# Patient Record
Sex: Male | Born: 2000 | Race: Black or African American | Hispanic: No | Smoking: Never smoker
Health system: Southern US, Community
[De-identification: ages and names within clinical notes are randomized; demographics above are authoritative.]

## PROBLEM LIST (undated history)

## (undated) ENCOUNTER — Ambulatory Visit: Discharge status: Absent without leave | Payer: Medicaid Other

## (undated) DIAGNOSIS — Z8739 Personal history of other diseases of the musculoskeletal system and connective tissue: Secondary | ICD-10-CM

## (undated) HISTORY — DX: Personal history of other diseases of the musculoskeletal system and connective tissue: Z87.39

---
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
## 2020-01-17 ENCOUNTER — Ambulatory Visit (INDEPENDENT_AMBULATORY_CARE_PROVIDER_SITE_OTHER): Payer: Medicaid Other

## 2020-01-17 ENCOUNTER — Other Ambulatory Visit: Payer: Self-pay

## 2020-01-17 ENCOUNTER — Ambulatory Visit
Admission: EM | Admit: 2020-01-17 | Discharge: 2020-01-17 | Disposition: A | Discharge status: Home / Self Care | Payer: Medicaid Other | Attending: Emergency Medicine | Admitting: Emergency Medicine

## 2020-01-17 DIAGNOSIS — G8929 Other chronic pain: Secondary | ICD-10-CM | POA: Diagnosis not present

## 2020-01-17 DIAGNOSIS — M25571 Pain in right ankle and joints of right foot: Secondary | ICD-10-CM

## 2020-01-17 DIAGNOSIS — S92251A Displaced fracture of navicular [scaphoid] of right foot, initial encounter for closed fracture: Secondary | ICD-10-CM | POA: Diagnosis not present

## 2020-01-17 DIAGNOSIS — S93409A Sprain of unspecified ligament of unspecified ankle, initial encounter: Secondary | ICD-10-CM

## 2020-01-17 DIAGNOSIS — Y9367 Activity, basketball: Secondary | ICD-10-CM | POA: Diagnosis not present

## 2020-01-17 NOTE — ED Triage Notes (Signed)
Pt c/o right ankle pain for approx 2 months s/p fall while playing basketball where he "rolled it" during a fall. Pt states he has been applying ice and taking tylenol. Pt c/o pain to right lateral ankle area and top of foot.  Foot/ankle warm to touch, +2 DP pulse, cap refill brisk, n/v sensation intact.

## 2020-01-17 NOTE — Discharge Instructions (Signed)

## 2020-01-17 NOTE — ED Provider Notes (Signed)
[facility]    CSN: [id number] Arrival date & time: 01/17/20  1352      History   Chief Complaint Chief Complaint  Patient presents with  . Ankle Pain    HPI Carl Morales is a 19 y.o. male presenting for 33-month course of persistent right ankle pain and swelling.  Patient states he plays basketball and frequently has ankle inversion injuries.  States his last one was about 2 months ago.  Typically goes away with RICE, though has been persistent.  Denies repeat traumatization.  Seeking x-ray today.   History reviewed. No pertinent past medical history.  There are no problems to display for this patient.   History reviewed. No pertinent surgical history.     Home Medications    Prior to Admission medications   Medication Sig Start Date End Date Taking? Authorizing Provider  fluticasone (FLONASE) 50 MCG/ACT nasal spray Place into the nose.   Yes [provider]  loratadine (CLARITIN) 10 MG tablet Take by mouth.   Yes [provider]    Family History Family History  Problem Relation Age of Onset  . Healthy Mother   . Diabetes Father     Social History Social History   Tobacco Use  . Smoking status: Never Smoker  . Smokeless tobacco: Never Used  Vaping Use  . Vaping Use: Never used  Substance Use Topics  . Alcohol use: Never  . Drug use: Never     Allergies   Patient has no known allergies.   Review of Systems As per HPI   Physical Exam Triage Vital Signs ED Triage Vitals  Enc Vitals Group     BP      Pulse      Resp      Temp      Temp src      SpO2      Weight      Height      Head Circumference      Peak Flow      Pain Score      Pain Loc      Pain Edu?      Excl. in Concrete?    No data found.  Updated Vital Signs BP (!) 146/84 (BP Location: Left Arm)   Pulse 71   Temp 98 F (36.7 C) (Oral)   Resp 16   SpO2 98%   Visual Acuity Right Eye Distance:   Left Eye Distance:   Bilateral Distance:     Right Eye Near:   Left Eye Near:    Bilateral Near:     Physical Exam Constitutional:      General: He is not in acute distress. HENT:     Head: Normocephalic and atraumatic.  Eyes:     General: No scleral icterus.    Pupils: Pupils are equal, round, and reactive to light.  Cardiovascular:     Rate and Rhythm: Normal rate.  Pulmonary:     Effort: Pulmonary effort is normal. No respiratory distress.     Breath sounds: No wheezing.  Musculoskeletal:     Comments: Mild swelling in right ankle as compared to left.  Lateral malleoli tenderness.  No medial malleoli tenderness.  No tarsal or phalangeal tenderness.  NVI.  Gait mildly antalgic, favoring right  Skin:    Coloration: Skin is not jaundiced or pale.  Neurological:     Mental Status: He is alert and oriented to person, place, and time.  UC Treatments / Results  Labs (all labs ordered are listed, but only abnormal results are displayed) Labs Reviewed - No data to display  EKG   Radiology DG Ankle Complete Right  Result Date: 01/17/2020 CLINICAL DATA:  Chronic right ankle pain for 2 months. Multiple prior sprains. Anterolateral pain. EXAM: RIGHT ANKLE - COMPLETE 3+ VIEW COMPARISON:  None. FINDINGS: There is avulsion of bone from the dorsal aspect of the navicular. Distal tibia and fibula and talus appear normal. Ankle effusion. IMPRESSION: 1. Avulsion of bone from the dorsal aspect of the navicular. 2. Ankle effusion. Electronically Signed   By: Francene Boyers M.D.   On: 01/17/2020 14:43    Procedures Procedures (including critical care time)  Medications Ordered in UC Medications - No data to display  Initial Impression / Assessment and Plan / UC Course  I have reviewed the triage vital signs and the nursing notes.  Pertinent labs & imaging results that were available during my care of the patient were reviewed by me and considered in my medical decision making (see chart for details).     Discussed low  utility of x-ray given 68-month chronicity.  Patient requesting radiography specifically.  Right ankle x-ray done office, reviewed by me radiology: Significant for avulsion of bone from dorsal aspect of navicular, ankle effusion.  Reviewed case with PA Earney Hamburg who agrees with A/P:  ASO applied in office which patient tolerated well.  Will follow up with sports medicine for further evaluation/management if needed.  Return precautions discussed, patient verbalized understanding and is agreeable to plan. Final Clinical Impressions(s) / UC Diagnoses   Final diagnoses:  Chronic pain of right ankle  Inversion sprain of ankle, initial encounter  Closed displaced fracture of navicular bone of right foot, initial encounter     Discharge Instructions     Recommend RICE: rest, ice, compression, elevation as needed for pain.    Heat therapy (hot compress, warm wash rag, hot showers, etc.) can help relax muscles and soothe muscle aches. Cold therapy (ice packs) can be used to help swelling both after injury and after prolonged use of areas of chronic pain/aches.  For pain: recommend 350 mg-1000 mg of Tylenol (acetaminophen) and/or 200 mg - 800 mg of Advil (ibuprofen, Motrin) every 8 hours as needed.  May alternate between the two throughout the day as they are generally safe to take together.  DO NOT exceed more than 3000 mg of Tylenol or 3200 mg of ibuprofen in a 24 hour period as this could damage your stomach, kidneys, liver, or increase your bleeding risk.    ED Prescriptions    None     PDMP not reviewed this encounter.   Hall[place]Potvin, [place], [place] 01/17/20 1511

## 2020-11-01 ENCOUNTER — Ambulatory Visit
Admission: EM | Admit: 2020-11-01 | Discharge: 2020-11-01 | Disposition: A | Discharge status: Home / Self Care | Payer: Medicaid Other | Attending: Urgent Care | Admitting: Urgent Care

## 2020-11-01 ENCOUNTER — Other Ambulatory Visit: Payer: Self-pay

## 2020-11-01 ENCOUNTER — Encounter: Payer: Self-pay | Admitting: Emergency Medicine

## 2020-11-01 DIAGNOSIS — R0789 Other chest pain: Secondary | ICD-10-CM | POA: Diagnosis not present

## 2020-11-01 DIAGNOSIS — R0602 Shortness of breath: Secondary | ICD-10-CM

## 2020-11-01 DIAGNOSIS — R9431 Abnormal electrocardiogram [ECG] [EKG]: Secondary | ICD-10-CM

## 2020-11-01 MED ORDER — FAMOTIDINE 20 MG PO TABS
20.0000 mg | ORAL_TABLET | Freq: Two times a day (BID) | ORAL | 0 refills | Status: DC
Start: 2020-11-01 — End: 2021-11-19

## 2020-11-01 NOTE — ED Provider Notes (Signed)
[facility]   MRN: [id number] DOB: 06/17/2001  Subjective:   Carl Morales is a 20 y.o. male presenting for 2-day history of acute onset midsternal chest pain with chest tightness, shortness of breath, heart racing and palpitations.  Patient is very worried about his symptoms.  This started when he was sitting on the couch watching TV.  Denies fever, headache, confusion, runny or stuffy nose, sore throat, cough, belly pain, nausea, vomiting.  Denies personal or family history of arrhythmias, heart conditions.  Patient works at a [employer] place and eats there multiple times a week.  Tries to hydrate with water.  Denies drug use, alcohol use, cigarette use.  He does have a history of allergies but does not take any medications consistently for this.  No current facility-administered medications for this encounter.  Current Outpatient Medications:  .  fluticasone (FLONASE) 50 MCG/ACT nasal spray, Place into the nose., Disp: , Rfl:  .  loratadine (CLARITIN) 10 MG tablet, Take by mouth., Disp: , Rfl:    No Known Allergies  History reviewed. No pertinent past medical history.   History reviewed. No pertinent surgical history.  Family History  Problem Relation Age of Onset  . Healthy Mother   . Diabetes Father     Social History   Tobacco Use  . Smoking status: Never Smoker  . Smokeless tobacco: Never Used  Vaping Use  . Vaping Use: Never used  Substance Use Topics  . Alcohol use: Never  . Drug use: Never    ROS   Objective:   Vitals: BP (!) 153/87 (BP Location: Left Arm)   Pulse 75   Temp 97.8 F (36.6 C) (Oral)   Resp 18   SpO2 100%   Physical Exam Constitutional:      General: He is not in acute distress.    Appearance: Normal appearance. He is well-developed. He is not ill-appearing, toxic-appearing or diaphoretic.  HENT:     Head: Normocephalic and atraumatic.     Right Ear: External ear normal.     Left Ear: External ear normal.     Nose:  Nose normal.     Mouth/Throat:     Mouth: Mucous membranes are moist.     Pharynx: Oropharynx is clear.  Eyes:     General: No scleral icterus.       Right eye: No discharge.        Left eye: No discharge.     Extraocular Movements: Extraocular movements intact.     Conjunctiva/sclera: Conjunctivae normal.     Pupils: Pupils are equal, round, and reactive to light.  Cardiovascular:     Rate and Rhythm: Normal rate and regular rhythm.     Heart sounds: Normal heart sounds. No murmur heard. No friction rub. No gallop.   Pulmonary:     Effort: Pulmonary effort is normal. No respiratory distress.     Breath sounds: Normal breath sounds. No stridor. No wheezing, rhonchi or rales.  Abdominal:     General: Bowel sounds are normal. There is no distension.     Palpations: Abdomen is soft. There is no mass.     Tenderness: There is no abdominal tenderness. There is no right CVA tenderness, left CVA tenderness, guarding or rebound.  Skin:    General: Skin is warm and dry.  Neurological:     Mental Status: He is alert and oriented to person, place, and time.  Psychiatric:        Mood and Affect:  Mood normal.        Behavior: Behavior normal.        Thought Content: Thought content normal.        Judgment: Judgment normal.     ED ECG REPORT   Date: 11/01/2020  EKG Time: 3:50 PM  Rate: 70bpm  Rhythm: normal sinus rhythm,  normal EKG, normal sinus rhythm, there are no previous tracings available for comparison, incomplete RBBB  Axis: normal  ST&T Change: none  Narrative Interpretation: Sinus rhythm at 70bpm with incomplete RBBB, early repolarization. No previous ecg for comparison.    Assessment and Plan :   PDMP not reviewed this encounter.  1. Atypical chest pain   2. Nonspecific abnormal electrocardiogram (ECG) (EKG)   3. Shortness of breath     Patient has non-specific abnormal ecg and needs cardiology consultation. Provided him with information for this. In the meantime,  will address GERD with famotidine, dietary modifications. Counseled patient on potential for adverse effects with medications prescribed/recommended today, ER and return-to-clinic precautions discussed, patient verbalized understanding.    Wallis Bamberg, [place] 11/01/20 [phone]

## 2020-11-01 NOTE — ED Triage Notes (Signed)
Pt here for anxiety with some chest tightness x 2 days; pt appears very anxious; pt with poor eye contact denies SI/HI

## 2020-11-09 ENCOUNTER — Ambulatory Visit: Payer: Medicaid Other | Admitting: Cardiology

## 2020-11-11 NOTE — Progress Notes (Signed)
Date:  11/12/2020   ID:  Carl Morales, DOB 07-09-2001, MRN [id number]  PCP:  Patient, No Pcp Per (Inactive)  Cardiologist:  Tessa Lerner, DO, [facility] (established care 11/12/2020)  REASON FOR CONSULT: History of scoliosis  REQUESTING PHYSICIAN:  Self-referral  Chief Complaint  Patient presents with  . Chest Pain  . New Patient (Initial Visit)    HPI  Carl Morales is a 20 y.o. male who presents to the office with a chief complaint of " chest pain." Patient's past medical history and cardiovascular risk factors include: History of scoliosis, incomplete right bundle branch block, family history of CAD.  After recent urgent care visit patient has a self-referred in to our practice for further evaluation of chest pain and scoliosis  Patient is accompanied by his mom Carl Morales and he provides verbal consent with regards to discussing his medical information in her presence.  Patient states that a couple weeks ago he went to urgent care for chest pain evaluation.  At that time the discomfort was located substernally, occurred at rest, 4 out of 10 in intensity, no improving or worsening factors.  The pain is not brought on by effort related activities and does not resolve with rest.  Patient states in the urgent care he was thought to be most likely heartburn related it was given appropriate pharmacological therapy and sent home on medications.  Since his urgent care visit patient states that he intermittently/randomly has chest discomfort.  Intensity 3 out of 10, lasting for 5 to 10 minutes, no predisposing factors, not brought on by up related activities and does not resolve with rest but still self-limited.  The discomfort is nonpositional, and nonpleuritic.  No recent infections, or sick contacts.  No known history of COVID-19 infection or autoimmune disorders.  Patient mom states that he was born healthy via vaginal delivery and no known congenital heart disease.  Patient has not been  evaluated by pediatric cardiologist in the past.  When growing up patient was very active and never has had exertional syncope.  He continues to play basketball at least for 2 hours every week at a local gym.  History is positive for scoliosis for which he used to wear a brace for approximately 1 year.  Paternal grandfather died of a myocardial infarction at the age of 8.  Per patient's mom no family history of sudden cardiac death, cardiomyopathies or congestive heart failure, or aortic syndromes.  FUNCTIONAL STATUS: He goes to the gym twice a week to play basketball for at least 2 hours.  ALLERGIES: No Known Allergies  MEDICATION LIST PRIOR TO VISIT: Current Meds  Medication Sig  . famotidine (PEPCID) 20 MG tablet Take 1 tablet (20 mg total) by mouth 2 (two) times daily.  . fluticasone (FLONASE) 50 MCG/ACT nasal spray Place into the nose.  . loratadine (CLARITIN) 10 MG tablet Take by mouth.     PAST MEDICAL HISTORY: Past Medical History:  Diagnosis Date  . History of scoliosis     PAST SURGICAL HISTORY: History reviewed. No pertinent surgical history.  FAMILY HISTORY: The patient family history includes Diabetes in his father; Healthy in his mother; Heart attack in his paternal grandfather.  SOCIAL HISTORY:  The patient  reports that he has never smoked. He has never used smokeless tobacco. He reports that he does not drink alcohol and does not use drugs.  REVIEW OF SYSTEMS: Review of Systems  Constitutional: Negative for chills and fever.  HENT: Negative for hoarse voice  and nosebleeds.   Eyes: Negative for discharge, double vision and pain.  Cardiovascular: Negative for chest pain, claudication, dyspnea on exertion, leg swelling, near-syncope, orthopnea, palpitations, paroxysmal nocturnal dyspnea and syncope.  Respiratory: Negative for hemoptysis and shortness of breath.   Musculoskeletal: Negative for muscle cramps and myalgias.  Gastrointestinal: Negative for  abdominal pain, constipation, diarrhea, hematemesis, hematochezia, melena, nausea and vomiting.  Neurological: Negative for dizziness and light-headedness.    PHYSICAL EXAM: Vitals with BMI 11/12/2020 11/01/2020 01/17/2020  Height 6\' 5"  - -  Weight 160 lbs 6 oz - -  BMI 19.02 - -  Systolic 139 153  Diastolic 85 87 84  Pulse 71 75 71    CONSTITUTIONAL: Well-developed and well-nourished. No acute distress.  SKIN: Skin is warm and dry. No rash noted. No cyanosis. No pallor. No jaundice HEAD: Normocephalic and atraumatic.  EYES: No scleral icterus MOUTH/THROAT: Moist oral membranes.  NECK: No JVD present. No thyromegaly noted. No carotid bruits  LYMPHATIC: No visible cervical adenopathy.  CHEST Normal respiratory effort. No intercostal retractions.  Posteriorly scoliotic curvature of the spine with convexity to the right side. LUNGS: Clear to auscultation bilaterally. no stridor. No wheezes. No rales.  CARDIOVASCULAR: Regular, positive S1-S2, no murmurs rubs or gallops appreciated ABDOMINAL: No apparent ascites.  EXTREMITIES: No peripheral edema  HEMATOLOGIC: No significant bruising NEUROLOGIC: Oriented to person, place, and time. Nonfocal. Normal muscle tone.  PSYCHIATRIC: Normal mood and affect. Normal behavior. Cooperative  RADIOLOGY: Chest x-ray 06/19/2014: Thoracolumbar scoliosis. The upper thoracic component convex to the left measures 17 degrees with a lower thoracic component convex to the right measuring 19 degrees.  CARDIAC DATABASE: EKG: 11/12/2020: Normal sinus rhythm, 79 bpm, incomplete right bundle branch block, without underlying injury pattern.  No prior EKGs for comparison.  Echocardiogram: No results found for this or any previous visit from the past 1095 days.   Stress Testing: No results found for this or any previous visit from the past 1095 days.  Heart Catheterization: None  LABORATORY DATA: No flowsheet data found.  No flowsheet data  found.  Lipid Panel  No results found for: CHOL, TRIG, HDL, CHOLHDL, VLDL, LDLCALC, LDLDIRECT, LABVLDL  No components found for: NTPROBNP No results for input(s): PROBNP in the last 8760 hours. No results for input(s): TSH in the last 8760 hours.  BMP No results for input(s): NA, K, CL, CO2, GLUCOSE, BUN, CREATININE, CALCIUM, GFRNONAA, GFRAA in the last 8760 hours.  HEMOGLOBIN A1C No results found for: HGBA1C, MPG  IMPRESSION:    ICD-10-CM   1. Precordial pain  R07.2 EKG 12-Lead    PCV ECHOCARDIOGRAM COMPLETE    PCV CARDIAC STRESS TEST    SARS-COV-2 RNA,(COVID-19) QUAL NAAT  2. History of scoliosis  Z87.39   3. Incomplete right bundle branch block  I45.10   4. Family history of heart disease  Z82.49      RECOMMENDATIONS: Carl Morales is a 20 y.o. male whose past medical history and cardiac risk factors include: History of scoliosis, incomplete right bundle branch block, family history of CAD.  Precordial pain: Most likely noncardiac in etiology.  Recommended conservative management. However, based on our discussion he continues to be very concerned and therefore the shared decision was to proceed with an ischemic evaluation. Echocardiogram will be ordered to evaluate for structural heart disease and left ventricular systolic function. Plan GXT to evaluate for exercise-induced ischemia. COVID screen prior to GXT. Encouraged to continue taking Pepcid.  However if he requires long-term therapy he  would be advisable to see gastroenterology to rule out underlying pathology.  Conveyed this to both the patient and his mom at today's office visit.  Family history of heart disease: Educated on the importance of primary prevention.  Incomplete right bundle branch block: Continue to monitor  FINAL MEDICATION LIST END OF ENCOUNTER: No orders of the defined types were placed in this encounter.   There are no discontinued medications.   Current Outpatient Medications:  .   famotidine (PEPCID) 20 MG tablet, Take 1 tablet (20 mg total) by mouth 2 (two) times daily., Disp: 60 tablet, Rfl: 0 .  fluticasone (FLONASE) 50 MCG/ACT nasal spray, Place into the nose., Disp: , Rfl:  .  loratadine (CLARITIN) 10 MG tablet, Take by mouth., Disp: , Rfl:   Orders Placed This Encounter  Procedures  . SARS-COV-2 RNA,(COVID-19) QUAL NAAT  . PCV CARDIAC STRESS TEST  . EKG 12-Lead  . PCV ECHOCARDIOGRAM COMPLETE    There are no Patient Instructions on file for this visit.   --Continue cardiac medications as reconciled in final medication list. --Return in about 6 weeks (around 12/24/2020) for Follow up, Chest pain, Review test results. Or sooner if needed. --Continue follow-up with your primary care physician regarding the management of your other chronic comorbid conditions.  Patient's questions and concerns were addressed to his satisfaction. He voices understanding of the instructions provided during this encounter.   This note was created using a voice recognition software as a result there may be grammatical errors inadvertently enclosed that do not reflect the nature of this encounter. Every attempt is made to correct such errors.  Tessa Lerner, [place]  Pager: [phone] Office: [phone]

## 2020-11-12 ENCOUNTER — Ambulatory Visit: Payer: Medicaid Other | Admitting: Cardiology

## 2020-11-12 ENCOUNTER — Other Ambulatory Visit: Payer: Self-pay

## 2020-11-12 ENCOUNTER — Encounter: Payer: Self-pay | Admitting: Cardiology

## 2020-11-12 VITALS — BP 139/85 | HR 71 | Temp 98.3°F | Resp 16 | Ht 77.0 in | Wt 160.4 lb

## 2020-11-12 DIAGNOSIS — Z8739 Personal history of other diseases of the musculoskeletal system and connective tissue: Secondary | ICD-10-CM

## 2020-11-12 DIAGNOSIS — Z8249 Family history of ischemic heart disease and other diseases of the circulatory system: Secondary | ICD-10-CM

## 2020-11-12 DIAGNOSIS — R072 Precordial pain: Secondary | ICD-10-CM

## 2020-11-12 DIAGNOSIS — I4510 Unspecified right bundle-branch block: Secondary | ICD-10-CM

## 2020-11-24 ENCOUNTER — Ambulatory Visit: Payer: Medicaid Other

## 2020-11-24 ENCOUNTER — Other Ambulatory Visit: Payer: Self-pay

## 2020-11-24 DIAGNOSIS — R072 Precordial pain: Secondary | ICD-10-CM

## 2020-11-27 ENCOUNTER — Ambulatory Visit: Payer: Medicaid Other

## 2020-11-27 ENCOUNTER — Other Ambulatory Visit: Payer: Self-pay

## 2020-11-27 DIAGNOSIS — R072 Precordial pain: Secondary | ICD-10-CM

## 2020-11-30 NOTE — Progress Notes (Signed)
Pt mother called back and was informed about is stress test

## 2020-11-30 NOTE — Progress Notes (Signed)
Called pt no answer, left a vm

## 2020-11-30 NOTE — Progress Notes (Signed)
Pt mother called back and was informed about his echo. She understood

## 2021-01-01 ENCOUNTER — Ambulatory Visit: Payer: Medicaid Other | Admitting: Cardiology

## 2021-01-14 ENCOUNTER — Ambulatory Visit (INDEPENDENT_AMBULATORY_CARE_PROVIDER_SITE_OTHER): Payer: Medicaid Other

## 2021-01-14 ENCOUNTER — Other Ambulatory Visit: Payer: Self-pay

## 2021-01-14 ENCOUNTER — Ambulatory Visit
Admission: EM | Admit: 2021-01-14 | Discharge: 2021-01-14 | Disposition: A | Discharge status: Home / Self Care | Payer: Medicaid Other | Attending: Emergency Medicine | Admitting: Emergency Medicine

## 2021-01-14 DIAGNOSIS — M25572 Pain in left ankle and joints of left foot: Secondary | ICD-10-CM

## 2021-01-14 DIAGNOSIS — S93492A Sprain of other ligament of left ankle, initial encounter: Secondary | ICD-10-CM | POA: Diagnosis not present

## 2021-01-14 MED ORDER — IBUPROFEN 800 MG PO TABS
800.0000 mg | ORAL_TABLET | Freq: Three times a day (TID) | ORAL | 0 refills | Status: DC
Start: 2021-01-14 — End: 2021-11-19

## 2021-01-14 NOTE — ED Provider Notes (Signed)
[facility]    CSN: [id number] Arrival date & time: 01/14/21  1441      History   Chief Complaint Chief Complaint  Patient presents with   Ankle Pain    left    HPI Carl Morales is a 20 y.o. male presenting today for left foot pain.  Reports left ankle injury approximately 2 weeks ago.  Was playing basketball came down and landed on someone else's foot causing his left ankle to invert.  Has had continued pain and swelling.  Denies any high impact activities since, but has continued to work and he is on his feet for a large portion of the day.  Denies history of prior fractures or ankle injuries.  HPI  Past Medical History:  Diagnosis Date   History of scoliosis     There are no problems to display for this patient.   History reviewed. No pertinent surgical history.     Home Medications    Prior to Admission medications   Medication Sig Start Date End Date Taking? Authorizing Provider  ibuprofen (ADVIL) 800 MG tablet Take 1 tablet (800 mg total) by mouth 3 (three) times daily. 01/14/21  Yes Tylique Aull C, PA-C  famotidine (PEPCID) 20 MG tablet Take 1 tablet (20 mg total) by mouth 2 (two) times daily. 11/01/20   Wallis Bamberg, PA-C  fluticasone (FLONASE) 50 MCG/ACT nasal spray Place into the nose.    [provider]  loratadine (CLARITIN) 10 MG tablet Take by mouth.    [provider]    Family History Family History  Problem Relation Age of Onset   Healthy Mother    Diabetes Father    Heart attack Paternal Grandfather     Social History Social History   Tobacco Use   Smoking status: Never   Smokeless tobacco: Never  Vaping Use   Vaping Use: Never used  Substance Use Topics   Alcohol use: Never   Drug use: Never     Allergies   Patient has no known allergies.   Review of Systems Review of Systems  Constitutional:  Negative for fatigue and fever.  Eyes:  Negative for redness, itching and visual disturbance.   Respiratory:  Negative for shortness of breath.   Cardiovascular:  Negative for chest pain and leg swelling.  Gastrointestinal:  Negative for nausea and vomiting.  Musculoskeletal:  Positive for arthralgias, gait problem and joint swelling. Negative for myalgias.  Skin:  Negative for color change, rash and wound.  Neurological:  Negative for dizziness, syncope, weakness, light-headedness and headaches.    Physical Exam Triage Vital Signs ED Triage Vitals  Enc Vitals Group     BP      Pulse      Resp      Temp      Temp src      SpO2      Weight      Height      Head Circumference      Peak Flow      Pain Score      Pain Loc      Pain Edu?      Excl. in GC?    No data found.  Updated Vital Signs BP 131/78 (BP Location: Left Arm)   Pulse 72   Temp 98.4 F (36.9 C) (Oral)   Resp 18   SpO2 98%   Visual Acuity Right Eye Distance:   Left Eye Distance:   Bilateral Distance:  Right Eye Near:   Left Eye Near:    Bilateral Near:     Physical Exam Vitals and nursing note reviewed.  Constitutional:      Appearance: He is well-developed.     Comments: No acute distress  HENT:     Head: Normocephalic and atraumatic.     Nose: Nose normal.  Eyes:     Conjunctiva/sclera: Conjunctivae normal.  Cardiovascular:     Rate and Rhythm: Normal rate.  Pulmonary:     Effort: Pulmonary effort is normal. No respiratory distress.  Abdominal:     General: There is no distension.  Musculoskeletal:        General: Normal range of motion.     Cervical back: Neck supple.     Comments: Left ankle: No obvious swelling or deformity, no overlying erythema or warmth, tender to palpation to distal 4 cm of fibula into lateral malleolus, tenderness extending anteriorly and mildly on medial malleolus, nontender throughout dorsum of foot, nontender along length of Achilles, feels firm and intact, negative Thompson's, dorsalis pedis 2+, sensation intact distally  Does have active  dorsiflexion and plantar flexion, but slightly limited due to pain Negative anterior drawer, no laxity appreciated, but pain elicited with this movement  Skin:    General: Skin is warm and dry.  Neurological:     Mental Status: He is alert and oriented to person, place, and time.     UC Treatments / Results  Labs (all labs ordered are listed, but only abnormal results are displayed) Labs Reviewed - No data to display  EKG   Radiology DG Ankle Complete Left  Result Date: 01/14/2021 CLINICAL DATA:  Ankle injury 1-2 weeks ago with limited range of motion. EXAM: LEFT ANKLE COMPLETE - 3+ VIEW COMPARISON:  None. FINDINGS: There is no evidence of fracture, dislocation, or joint effusion. There is no evidence of arthropathy or other focal bone abnormality. Soft tissues are unremarkable. IMPRESSION: Negative. Electronically Signed   By: Kennith Center M.D.   On: 01/14/2021 15:32    Procedures Procedures (including critical care time)  Medications Ordered in UC Medications - No data to display  Initial Impression / Assessment and Plan / UC Course  I have reviewed the triage vital signs and the nursing notes.  Pertinent labs & imaging results that were available during my care of the patient were reviewed by me and considered in my medical decision making (see chart for details).     X-ray negative for fracture, will continue to treat as left ankle sprain, likely ATFL, left follow-up with sports medicine if symptoms persisting.  Placing in left ASO, discussed activity modification, gentle range of motion exercises ice elevate and anti-inflammatories.  Discussed strict return precautions. Patient verbalized understanding and is agreeable with plan.  Final Clinical Impressions(s) / UC Diagnoses   Final diagnoses:  Sprain of anterior talofibular ligament of left ankle, initial encounter     Discharge Instructions      No fracture on x-ray Wear ankle brace over the next 2  weeks Follow-up with sports medicine if persisting Tylenol and ibuprofen for pain and swelling Ice and elevate Gentle range of motion exercises Follow-up for any concerns     ED Prescriptions     Medication Sig Dispense Auth. Provider   ibuprofen (ADVIL) 800 MG tablet Take 1 tablet (800 mg total) by mouth 3 (three) times daily. 21 tablet Valdez Brannan, Stollings C, PA-C      PDMP not reviewed this encounter.   Melanie Pellot,  Vesna Kable C, PA-C 01/14/21 1600

## 2021-01-14 NOTE — Discharge Instructions (Addendum)
No fracture on x-ray Wear ankle brace over the next 2 weeks Follow-up with sports medicine if persisting Tylenol and ibuprofen for pain and swelling Ice and elevate Gentle range of motion exercises Follow-up for any concerns

## 2021-01-14 NOTE — ED Triage Notes (Signed)
Approx 1-2 weeks ago, while playing basketball patient fell on his left ankle causing a sudden onset of pain and swelling. No meds taken.Confirms limited ROM.

## 2021-05-15 ENCOUNTER — Ambulatory Visit
Admission: EM | Admit: 2021-05-15 | Discharge: 2021-05-15 | Disposition: A | Discharge status: Home / Self Care | Payer: Medicaid Other | Attending: Physician Assistant | Admitting: Physician Assistant

## 2021-05-15 DIAGNOSIS — M545 Low back pain, unspecified: Secondary | ICD-10-CM | POA: Diagnosis not present

## 2021-05-15 MED ORDER — CYCLOBENZAPRINE HCL 10 MG PO TABS
10.0000 mg | ORAL_TABLET | Freq: Two times a day (BID) | ORAL | 0 refills | Status: DC | PRN
Start: 2021-05-15 — End: 2021-11-19

## 2021-05-15 MED ORDER — PREDNISONE 20 MG PO TABS: 40.0000 mg | ORAL_TABLET | Freq: Every day | ORAL | 0 refills | Status: AC

## 2021-05-15 NOTE — ED Triage Notes (Signed)
Pt present back and [place] pain, symptoms started two weeks ago. Pt states the pain starts from bending and exertion.

## 2021-05-15 NOTE — Discharge Instructions (Signed)
Muscle relaxer may cause drowsiness, use with caution. Follow up with any further concerns

## 2021-05-15 NOTE — ED Provider Notes (Signed)
[facility]    CSN: [id number] Arrival date & time: 05/15/21  1330      History   Chief Complaint Chief Complaint  Patient presents with   Back Pain    HPI Carl Morales is a 20 y.o. male.   Patient here today for evaluation of low back pain that he has had for 2 weeks.  He reports that working makes symptoms worse because he has to bend frequently and pick things up.  He denies any numbness or tingling.  He denies any known injury.  He has tried a topical treatment for pain which has been somewhat helpful.  The history is provided by the patient.  Back Pain Associated symptoms: no fever and no numbness    Past Medical History:  Diagnosis Date   History of scoliosis     There are no problems to display for this patient.   History reviewed. No pertinent surgical history.     Home Medications    Prior to Admission medications   Medication Sig Start Date End Date Taking? Authorizing Provider  cyclobenzaprine (FLEXERIL) 10 MG tablet Take 1 tablet (10 mg total) by mouth 2 (two) times daily as needed for muscle spasms. 05/15/21  Yes Tomi Bamberger, PA-C  predniSONE (DELTASONE) 20 MG tablet Take 2 tablets (40 mg total) by mouth daily with breakfast for 5 days. 05/15/21 05/20/21 Yes Tomi Bamberger, PA-C  famotidine (PEPCID) 20 MG tablet Take 1 tablet (20 mg total) by mouth 2 (two) times daily. 11/01/20   Wallis Bamberg, PA-C  fluticasone (FLONASE) 50 MCG/ACT nasal spray Place into the nose.    [provider]  ibuprofen (ADVIL) 800 MG tablet Take 1 tablet (800 mg total) by mouth 3 (three) times daily. 01/14/21   Wieters, Hallie C, PA-C  loratadine (CLARITIN) 10 MG tablet Take by mouth.    [provider]    Family History Family History  Problem Relation Age of Onset   Healthy Mother    Diabetes Father    Heart attack Paternal Grandfather     Social History Social History   Tobacco Use   Smoking status: Never   Smokeless tobacco:  Never  Vaping Use   Vaping Use: Never used  Substance Use Topics   Alcohol use: Never   Drug use: Never     Allergies   Patient has no known allergies.   Review of Systems Review of Systems  Constitutional:  Negative for chills and fever.  Eyes:  Negative for discharge and redness.  Respiratory:  Negative for shortness of breath.   Musculoskeletal:  Positive for back pain and myalgias.  Neurological:  Negative for numbness.    Physical Exam Triage Vital Signs ED Triage Vitals  Enc Vitals Group     BP 05/15/21 1352 133/77     Pulse Rate 05/15/21 1352 86     Resp 05/15/21 1352 18     Temp 05/15/21 1352 98.3 F (36.8 C)     Temp Source 05/15/21 1352 Oral     SpO2 05/15/21 1352 98 %     Weight --      Height --      Head Circumference --      Peak Flow --      Pain Score 05/15/21 1353 6     Pain Loc --      Pain Edu? --      Excl. in GC? --    No data found.  Updated  Vital Signs BP 133/77 (BP Location: Right Arm)   Pulse 86   Temp 98.3 F (36.8 C) (Oral)   Resp 18   SpO2 98%   Physical Exam Vitals and nursing note reviewed.  Constitutional:      General: He is not in acute distress.    Appearance: Normal appearance. He is not ill-appearing.  HENT:     Head: Normocephalic and atraumatic.  Eyes:     Conjunctiva/sclera: Conjunctivae normal.  Cardiovascular:     Rate and Rhythm: Normal rate.  Pulmonary:     Effort: Pulmonary effort is normal.  Musculoskeletal:     Comments: No midline tenderness to spine, mild TTP to right lower back with spasm noted  Neurological:     Mental Status: He is alert.  Psychiatric:        Mood and Affect: Mood normal.        Behavior: Behavior normal.        Thought Content: Thought content normal.     UC Treatments / Results  Labs (all labs ordered are listed, but only abnormal results are displayed) Labs Reviewed - No data to display  EKG   Radiology No results found.  Procedures Procedures (including  critical care time)  Medications Ordered in UC Medications - No data to display  Initial Impression / Assessment and Plan / UC Course  I have reviewed the triage vital signs and the nursing notes.  Pertinent labs & imaging results that were available during my care of the patient were reviewed by me and considered in my medical decision making (see chart for details).  Suspect likely muscular strain and recommended treatment with steroid and muscle relaxer.  Advised muscle relaxer may cause some drowsiness.  Recommended no heavy lifting for the next few days.  Encouraged slow controlled stretches.  Encouraged follow-up with any further concerns or worsening symptoms.  Work note provided as requested.  Final Clinical Impressions(s) / UC Diagnoses   Final diagnoses:  Acute right-sided low back pain without sciatica     Discharge Instructions      Muscle relaxer may cause drowsiness, use with caution. Follow up with any further concerns     ED Prescriptions     Medication Sig Dispense Auth. Provider   predniSONE (DELTASONE) 20 MG tablet Take 2 tablets (40 mg total) by mouth daily with breakfast for 5 days. 10 tablet Erma Pinto F, PA-C   cyclobenzaprine (FLEXERIL) 10 MG tablet Take 1 tablet (10 mg total) by mouth 2 (two) times daily as needed for muscle spasms. 20 tablet Tomi Bamberger, PA-C      PDMP not reviewed this encounter.   Tomi Bamberger, PA-C 05/15/21 1426

## 2021-08-24 ENCOUNTER — Emergency Department (HOSPITAL_BASED_OUTPATIENT_CLINIC_OR_DEPARTMENT_OTHER): Payer: Medicaid Other | Admitting: Radiology

## 2021-08-24 ENCOUNTER — Encounter (HOSPITAL_BASED_OUTPATIENT_CLINIC_OR_DEPARTMENT_OTHER): Payer: Self-pay

## 2021-08-24 ENCOUNTER — Other Ambulatory Visit: Payer: Self-pay

## 2021-08-24 ENCOUNTER — Emergency Department (HOSPITAL_BASED_OUTPATIENT_CLINIC_OR_DEPARTMENT_OTHER)
Admission: EM | Admit: 2021-08-24 | Discharge: 2021-08-24 | Disposition: A | Discharge status: Home / Self Care | Payer: Medicaid Other | Attending: Emergency Medicine | Admitting: Emergency Medicine

## 2021-08-24 DIAGNOSIS — Y9367 Activity, basketball: Secondary | ICD-10-CM | POA: Diagnosis not present

## 2021-08-24 DIAGNOSIS — Z79899 Other long term (current) drug therapy: Secondary | ICD-10-CM | POA: Diagnosis not present

## 2021-08-24 DIAGNOSIS — S93402A Sprain of unspecified ligament of left ankle, initial encounter: Secondary | ICD-10-CM | POA: Diagnosis not present

## 2021-08-24 DIAGNOSIS — X501XXA Overexertion from prolonged static or awkward postures, initial encounter: Secondary | ICD-10-CM | POA: Insufficient documentation

## 2021-08-24 DIAGNOSIS — S99912A Unspecified injury of left ankle, initial encounter: Secondary | ICD-10-CM | POA: Diagnosis present

## 2021-08-24 MED ORDER — IBUPROFEN 800 MG PO TABS
800.0000 mg | ORAL_TABLET | Freq: Once | ORAL | Status: AC
  Administered 2021-08-24: 11:00:00 800 mg via ORAL
  Filled 2021-08-24: qty 1

## 2021-08-24 NOTE — ED Provider Notes (Signed)
[facility] Provider Note   CSN: [id number] Arrival date & time: 08/24/21  0940     History  Chief Complaint  Patient presents with   Ankle Pain    Edmundo Lonsdale is a 21 y.o. male.  Patient is a 21 year old male with no significant past medical history presenting today due to left ankle pain.  Patient was playing basketball yesterday when he twisted it.  Since that time the pain has worsened and he is not able to bear any weight now because it is too painful.  He has had swelling over the lateral portion of his left ankle.  He denies pain anywhere else.  The history is provided by the patient.  Ankle Pain Location:  Ankle     Home Medications Prior to Admission medications   Medication Sig Start Date End Date Taking? Authorizing Provider  cyclobenzaprine (FLEXERIL) 10 MG tablet Take 1 tablet (10 mg total) by mouth 2 (two) times daily as needed for muscle spasms. 05/15/21   Francene Finders, PA-C  famotidine (PEPCID) 20 MG tablet Take 1 tablet (20 mg total) by mouth 2 (two) times daily. 11/01/20   Jaynee Eagles, PA-C  fluticasone (FLONASE) 50 MCG/ACT nasal spray Place into the nose.    [provider]  ibuprofen (ADVIL) 800 MG tablet Take 1 tablet (800 mg total) by mouth 3 (three) times daily. 01/14/21   Wieters, Hallie C, PA-C  loratadine (CLARITIN) 10 MG tablet Take by mouth.    [provider]      Allergies    Patient has no known allergies.    Review of Systems   Review of Systems  Physical Exam Updated Vital Signs BP 138/78 (BP Location: Left Arm)    Pulse 88    Temp 98.2 F (36.8 C) (Temporal)    Resp 16    Ht 6\' 5"  (1.956 m)    Wt 74.8 kg    SpO2 100%    BMI 19.57 kg/m  Physical Exam Vitals and nursing note reviewed.  Constitutional:      General: He is not in acute distress.    Appearance: Normal appearance. He is normal weight.  Cardiovascular:     Rate and Rhythm: Normal rate.  Pulmonary:     Effort: Pulmonary  effort is normal.  Musculoskeletal:        General: Tenderness and signs of injury present. No deformity.     Left ankle: Swelling present. Tenderness present over the lateral malleolus. No base of 5th metatarsal or proximal fibula tenderness. Decreased range of motion.  Neurological:     Mental Status: He is alert and oriented to person, place, and time. Mental status is at baseline.  Psychiatric:        Mood and Affect: Mood normal.        Behavior: Behavior normal.    ED Results / Procedures / Treatments   Labs (all labs ordered are listed, but only abnormal results are displayed) Labs Reviewed - No data to display  EKG None  Radiology DG Ankle Complete Left  Result Date: 08/24/2021 CLINICAL DATA:  Injury with pain and swelling none EXAM: LEFT ANKLE COMPLETE - 3 x-ray VIEW COMPARISON:  Dated January 14, 2021 FINDINGS: There is no evidence of acute fracture, dislocation, or joint effusion. Well corticated ossicle seen adjacent to the dorsal navicular bone, likely sequela of remote prior injury. There is no evidence of arthropathy or other focal bone abnormality. Soft tissues are unremarkable. IMPRESSION: No  acute osseous abnormality. Electronically Signed   By: Yetta Glassman M.D.   On: 08/24/2021 10:21    Procedures Procedures    Medications Ordered in ED Medications - No data to display  ED Course/ Medical Decision Making/ A&P                           Medical Decision Making Amount and/or Complexity of Data Reviewed Radiology: ordered and independent interpretation performed. Decision-making details documented in ED Course.   Patient is a healthy 20 year old male presenting today with an ankle injury after playing basketball yesterday.  Patient has significant swelling over the left lateral malleolus.  Otherwise neurovascularly intact.  No fibular head tenderness.  X-rays are negative based on my independent interpretation.  We will treat as an ankle sprain.  Patient given  crutches due to inability to weight-bear.  He was given pain control with ibuprofen and Tylenol.  He was given follow-up with sports medicine if symptoms do not improve.  He was placed in a Aircast.        Final Clinical Impression(s) / ED Diagnoses Final diagnoses:  Sprain of left ankle, unspecified ligament, initial encounter    Rx / DC Orders ED Discharge Orders     None         Blanchie Dessert, MD 08/24/21 1035

## 2021-08-24 NOTE — ED Triage Notes (Signed)
Pt c/o L ankle pain after rolling it last PM while playing basketball.

## 2021-08-24 NOTE — ED Notes (Signed)
Pt ambulated around with the crutches appropriately and understood how to use them. Pts cast was placed and shown how to use and understood.

## 2021-11-19 ENCOUNTER — Ambulatory Visit
Admission: EM | Admit: 2021-11-19 | Discharge: 2021-11-19 | Disposition: A | Discharge status: Home / Self Care | Payer: Medicaid Other | Attending: Physician Assistant | Admitting: Physician Assistant

## 2021-11-19 DIAGNOSIS — J029 Acute pharyngitis, unspecified: Secondary | ICD-10-CM | POA: Diagnosis not present

## 2021-11-19 DIAGNOSIS — B2790 Infectious mononucleosis, unspecified without complication: Secondary | ICD-10-CM

## 2021-11-19 DIAGNOSIS — R030 Elevated blood-pressure reading, without diagnosis of hypertension: Secondary | ICD-10-CM | POA: Diagnosis not present

## 2021-11-19 LAB — POCT MONO SCREEN (KUC): Mono, POC: POSITIVE — AB

## 2021-11-19 LAB — POCT RAPID STREP A (OFFICE): Rapid Strep A Screen: NEGATIVE

## 2021-11-19 MED ORDER — PREDNISONE 20 MG PO TABS: 40.0000 mg | ORAL_TABLET | Freq: Every day | ORAL | 0 refills | Status: AC

## 2021-11-19 MED ORDER — LIDOCAINE VISCOUS HCL 2 % MT SOLN: 15.0000 mL | Freq: Four times a day (QID) | OROMUCOSAL | 0 refills | Status: AC | PRN

## 2021-11-19 MED ORDER — LIDOCAINE VISCOUS HCL 2 % MT SOLN
15.0000 mL | Freq: Once | OROMUCOSAL | Status: AC
  Administered 2021-11-19: 15 mL via OROMUCOSAL

## 2021-11-19 NOTE — ED Provider Notes (Signed)
?[facility] ? ? ? ?CSN: [id number] ?Arrival date & time: 11/19/21  4010 ? ? ?  ? ?History   ?Chief Complaint ?Chief Complaint  ?Patient presents with  ? Sore Throat  ? ? ?HPI ?Carl Morales is a 21 y.o. male.  ? ?Patient presents today with a 3-day history of severe sore throat.  He reports pain is rated 6 on a 0-10 pain scale, described as aching, worse with swallowing, no alleviating factors identified.  Reports some mild nasal congestion but denies any significant cough, shortness of breath, nausea, vomiting, diarrhea, fever.  Denies any known sick contacts.  He has not had COVID in the past.  Has not had COVID-19 vaccine.  He does have a history of allergies and took allergy medicine at symptom onset without improvement of symptoms.  Has not tried additional over-the-counter medication for symptom management.  Denies any recent antibiotic or steroid use.  He is eating and drinking despite symptoms.  Denies any muffled voice, throat swelling, dysphagia. ? ? ?Past Medical History:  ?Diagnosis Date  ? History of scoliosis   ? ? ?There are no problems to display for this patient. ? ? ?History reviewed. No pertinent surgical history. ? ? ? ? ?Home Medications   ? ?Prior to Admission medications   ?Medication Sig Start Date End Date Taking? Authorizing Provider  ?lidocaine (XYLOCAINE) 2 % solution Use as directed 15 mLs in the mouth or throat every 6 (six) hours as needed for mouth pain. 11/19/21  Yes Manual Navarra K, PA-C  ?predniSONE (DELTASONE) 20 MG tablet Take 2 tablets (40 mg total) by mouth daily for 3 days. 11/19/21 11/22/21 Yes Kristene Liberati, Noberto Retort, PA-C  ? ? ?Family History ?Family History  ?Problem Relation Age of Onset  ? Healthy Mother   ? Diabetes Father   ? Heart attack Paternal Grandfather   ? ? ?Social History ?Social History  ? ?Tobacco Use  ? Smoking status: Never  ? Smokeless tobacco: Never  ?Vaping Use  ? Vaping Use: Never used  ?Substance Use Topics  ? Alcohol use: Never  ? Drug use: Never   ? ? ? ?Allergies   ?Patient has no known allergies. ? ? ?Review of Systems ?Review of Systems  ?Constitutional:  Positive for activity change. Negative for appetite change, fatigue and fever.  ?HENT:  Positive for congestion and sore throat. Negative for sinus pressure and sneezing.   ?Respiratory:  Negative for cough and shortness of breath.   ?Cardiovascular:  Negative for chest pain.  ?Gastrointestinal:  Negative for abdominal pain, diarrhea, nausea and vomiting.  ?Neurological:  Negative for dizziness, light-headedness and headaches.  ? ? ?Physical Exam ?Triage Vital Signs ?ED Triage Vitals  ?Enc Vitals Group  ?   BP 11/19/21 1138 (!) 163/78  ?   Pulse Rate 11/19/21 1138 75  ?   Resp 11/19/21 1138 18  ?   Temp 11/19/21 1138 99 ?F (37.2 ?C)  ?   Temp Source 11/19/21 1138 Oral  ?   SpO2 11/19/21 1138 98 %  ?   Weight --   ?   Height --   ?   Head Circumference --   ?   Peak Flow --   ?   Pain Score 11/19/21 1139 0  ?   Pain Loc --   ?   Pain Edu? --   ?   Excl. in GC? --   ? ?No data found. ? ?Updated Vital Signs ?BP (!) 180/60  Pulse 78   Temp 99 ?F (37.2 ?C) (Oral)   Resp 18   SpO2 99%  ? ?Visual Acuity ?Right Eye Distance:   ?Left Eye Distance:   ?Bilateral Distance:   ? ?Right Eye Near:   ?Left Eye Near:    ?Bilateral Near:    ? ?Physical Exam ?Vitals reviewed.  ?Constitutional:   ?   General: He is awake.  ?   Appearance: Normal appearance. He is well-developed. He is not ill-appearing.  ?   Comments: Very pleasant male appears stated age in no acute distress  ?HENT:  ?   Head: Normocephalic and atraumatic.  ?   Right Ear: Tympanic membrane, ear canal and external ear normal. Tympanic membrane is not erythematous or bulging.  ?   Left Ear: Tympanic membrane, ear canal and external ear normal. Tympanic membrane is not erythematous or bulging.  ?   Nose: Nose normal.  ?   Mouth/Throat:  ?   Pharynx: Uvula midline. Posterior oropharyngeal erythema present. No oropharyngeal exudate or uvula swelling.  ?    Tonsils: Tonsillar exudate present. No tonsillar abscesses. 1+ on the right. 1+ on the left.  ?Cardiovascular:  ?   Rate and Rhythm: Normal rate and regular rhythm.  ?   Heart sounds: Normal heart sounds, S1 normal and S2 normal. No murmur heard. ?Pulmonary:  ?   Effort: Pulmonary effort is normal. No accessory muscle usage or respiratory distress.  ?   Breath sounds: Normal breath sounds. No stridor. No wheezing, rhonchi or rales.  ?   Comments: Clear to auscultation bilaterally ?Abdominal:  ?   General: Bowel sounds are normal.  ?   Palpations: Abdomen is soft.  ?   Tenderness: There is no abdominal tenderness.  ?Neurological:  ?   Mental Status: He is alert.  ?Psychiatric:     ?   Behavior: Behavior is cooperative.  ? ? ? ?UC Treatments / Results  ?Labs ?(all labs ordered are listed, but only abnormal results are displayed) ?Labs Reviewed  ?POCT MONO SCREEN ([facility]) - Abnormal; Notable for the following components:  ?    Result Value  ? Mono, POC Positive (*)   ? All other components within normal limits  ?POCT RAPID STREP A (OFFICE)  ? ? ?EKG ? ? ?Radiology ?No results found. ? ?Procedures ?Procedures (including critical care time) ? ?Medications Ordered in UC ?Medications  ?lidocaine (XYLOCAINE) 2 % viscous mouth solution 15 mL (15 mLs Mouth/Throat Given 11/19/21 1342)  ? ? ?Initial Impression / Assessment and Plan / UC Course  ?I have reviewed the triage vital signs and the nursing notes. ? ?Pertinent labs & imaging results that were available during my care of the patient were reviewed by me and considered in my medical decision making (see chart for details). ? ?  ? ?Rapid strep was negative in clinic today.  Mono testing was positive.  Discussed this is likely cause of his symptoms.  Discussed that this is self-limiting illness that can take several weeks to resolve.  Given severity of sore throat and swelling of tonsils he was given prednisone 40 mg for 3 days with instruction to take NSAIDs with this  medication due to risk of GI bleeding.  He can use Tylenol for breakthrough pain.  Recommend he gargle with warm salt water.  He was given lidocaine in clinic with minimal improvement of symptoms and so this prescription was sent to the pharmacy.  Discussed that if he has any difficulty breathing, swelling  of his throat, shortness of breath, high fever not responding to medication, abdominal pain he needs to go to the emergency room.  Work excuse note provided. ? ?Blood pressure is elevated today.  Patient believes this is related to acute illness.  He denies any signs/symptoms of endorgan damage.  Denies history of hypertension.  Recommended that he avoid NSAIDs, caffeine, sodium, decongestants.  Discussed that he should monitor his blood pressure at home and if persistently above 140/90 he is to return to consider starting medication.  Recommended that he follow-up with our clinic within a few days if he is unable to see his PCP for blood pressure recheck.  Discussed that if he develops any chest pain, shortness of breath, headache, vision change, dizziness in the setting of high blood pressure he needs to go to the emergency room to which she expressed understanding. ? ?Final Clinical Impressions(s) / UC Diagnoses  ? ?Final diagnoses:  ?Infectious mononucleosis without complication, infectious mononucleosis due to unspecified organism  ?Sore throat  ?Elevated blood pressure reading  ? ? ? ?Discharge Instructions   ? ?  ?You tested positive for mono.  This is a virus that your body will clear on its own.  I have sent in prednisone to help with pain and inflammation.  Do not take NSAIDs including aspirin, ibuprofen/Advil, naproxen/Aleve.  You can use Tylenol for breakthrough pain.  Gargle with warm salt water.  As we discussed, mono can cause your spleen to enlarge.  Please avoid any strenuous activity or contact sports for minimum of 6 weeks.  If you are involved in any kind of accident where your abdomen is hit  you should go to the emergency room during this timeframe. ? ? ? ? ?ED Prescriptions   ? ? Medication Sig Dispense Auth. Provider  ? predniSONE (DELTASONE) 20 MG tablet Take 2 tablets (40 mg total) by mouth daily for

## 2021-11-19 NOTE — Discharge Instructions (Signed)
You tested positive for mono.  This is a virus that your body will clear on its own.  I have sent in prednisone to help with pain and inflammation.  Do not take NSAIDs including aspirin, ibuprofen/Advil, naproxen/Aleve.  You can use Tylenol for breakthrough pain.  Gargle with warm salt water.  As we discussed, mono can cause your spleen to enlarge.  Please avoid any strenuous activity or contact sports for minimum of 6 weeks.  If you are involved in any kind of accident where your abdomen is hit you should go to the emergency room during this timeframe. ?

## 2021-11-19 NOTE — ED Triage Notes (Signed)
Pt c/o sore throat, nasal congestion, ear pressure, chills, headache onset ~ 3 days ago.  ? ?Denies body aches, nausea, vomiting, cough  ?

## 2021-11-26 ENCOUNTER — Ambulatory Visit
Admission: RE | Admit: 2021-11-26 | Discharge: 2021-11-26 | Disposition: A | Discharge status: Home / Self Care | Payer: Medicaid Other | Source: Ambulatory Visit

## 2021-11-26 VITALS — BP 132/74 | HR 79 | Temp 98.5°F | Resp 18

## 2021-11-26 DIAGNOSIS — B2790 Infectious mononucleosis, unspecified without complication: Secondary | ICD-10-CM | POA: Diagnosis not present

## 2021-11-26 NOTE — Discharge Instructions (Addendum)
Patient to continue fluids rest. Will provide additional work note for several days off.  Patient advised to return if any problems. ?

## 2021-11-26 NOTE — ED Triage Notes (Signed)
Pt here for f/u 2/2 mono dx. States he is feeling better and voice is returning.  ?

## 2021-11-26 NOTE — ED Provider Notes (Signed)
?[facility] ? ? ? ?CSN: [id number] ?Arrival date & time: 11/26/21  1123 ? ? ?  ? ?History   ?Chief Complaint ?Chief Complaint  ?Patient presents with  ? Follow-up  ?  Follow-Up for Mono - Entered by patient  ? ? ?HPI ?Carl Morales is a 21 y.o. male.  ? ?This is a 21 year old male who returns for evaluation his blood patient relates that he is doing well he has not had any problems or side effects from the medication. He relates he is still using some lidocaine for the sore throat however it has really improved. He also has finished a course of prednisone and he relates that his sore throat and tenderness has resolved. He has not had any fevers or chills no difficulty swallowing,patient relates he feels better and is not having any fatigue.  He does have still some mild tenderness in the left upper quadrant. ? ?History.  But he relates that his appetite is good and drinking fluids and is having normal activity. He does relate due to his job being very physical, we have decided to give him another several days off work as he continues to improve. ? ? ? ?Past Medical History:  ?Diagnosis Date  ? History of scoliosis   ? ? ?There are no problems to display for this patient. ? ? ?History reviewed. No pertinent surgical history. ? ? ? ? ?Home Medications   ? ?Prior to Admission medications   ?Medication Sig Start Date End Date Taking? Authorizing Provider  ?lidocaine (XYLOCAINE) 2 % solution Use as directed 15 mLs in the mouth or throat every 6 (six) hours as needed for mouth pain. 11/19/21   Raspet, Noberto Retort, PA-C  ? ? ?Family History ?Family History  ?Problem Relation Age of Onset  ? Healthy Mother   ? Diabetes Father   ? Heart attack Paternal Grandfather   ? ? ?Social History ?Social History  ? ?Tobacco Use  ? Smoking status: Never  ? Smokeless tobacco: Never  ?Vaping Use  ? Vaping Use: Never used  ?Substance Use Topics  ? Alcohol use: Never  ? Drug use: Never  ? ? ? ?Allergies   ?Patient has no known  allergies. ? ? ?Review of Systems ?Review of Systems  ?HENT:  Negative for mouth sores, sore throat and trouble swallowing.   ?Respiratory:  Negative for shortness of breath.   ?Gastrointestinal:  Negative for abdominal pain.  ? ? ?Physical Exam ?Triage Vital Signs ?ED Triage Vitals [11/26/21 1135]  ?Enc Vitals Group  ?   BP 132/74  ?   Pulse Rate 79  ?   Resp 18  ?   Temp 98.5 ?F (36.9 ?C)  ?   Temp Source Oral  ?   SpO2 98 %  ?   Weight   ?   Height   ?   Head Circumference   ?   Peak Flow   ?   Pain Score 0  ?   Pain Loc   ?   Pain Edu?   ?   Excl. in GC?   ? ?No data found. ? ?Updated Vital Signs ?BP 132/74 (BP Location: Left Arm)   Pulse 79   Temp 98.5 ?F (36.9 ?C) (Oral)   Resp 18   SpO2 98%  ? ?Visual Acuity ?Right Eye Distance:   ?Left Eye Distance:   ?Bilateral Distance:   ? ?Right Eye Near:   ?Left Eye Near:    ?Bilateral Near:    ? ?  Physical Exam ?Constitutional:   ?   Appearance: Normal appearance.  ?HENT:  ?   Mouth/Throat:  ?   Mouth: Mucous membranes are moist.  ?   Pharynx: Oropharynx is clear. No oropharyngeal exudate or posterior oropharyngeal erythema.  ?Cardiovascular:  ?   Rate and Rhythm: Normal rate and regular rhythm.  ?Pulmonary:  ?   Effort: Pulmonary effort is normal.  ?   Breath sounds: Normal breath sounds.  ?Abdominal:  ?   General: Bowel sounds are normal.  ?   Tenderness: There is abdominal tenderness.  ?   Comments: Abdomen LU quadrant no rebound, no swelling, no spleenomegally noted.  ?Neurological:  ?   Mental Status: He is alert.  ? ? ? ?UC Treatments / Results  ?Labs ?(all labs ordered are listed, but only abnormal results are displayed) ?Labs Reviewed - No data to display ? ?EKG ? ? ?Radiology ?No results found. ? ?Procedures ?Procedures (including critical care time) ? ?Medications Ordered in UC ?Medications - No data to display ? ?Initial Impression / Assessment and Plan / UC Course  ?I have reviewed the triage vital signs and the nursing notes. ? ?Pertinent labs & imaging  results that were available during my care of the patient were reviewed by me and considered in my medical decision making (see chart for details). ? ? Patient advised to continue restriction from sporting events for the next 5 weeks. Patient advised to return if symptoms rebound. ? ? ?Final Clinical Impressions(s) / UC Diagnoses  ? ?Final diagnoses:  ?Infectious mononucleosis without complication, infectious mononucleosis due to unspecified organism  ? ? ? ?Discharge Instructions   ? ?  ?Patient to continue fluids rest. Will provide additional work note for several days off.  Patient advised to return if any problems. ? ? ? ? ?ED Prescriptions   ?None ?  ? ?PDMP not reviewed this encounter. ?  ?Ellsworth Lennox, PA-C ?11/26/21 1207 ? ?

## 2022-04-07 ENCOUNTER — Encounter: Payer: Self-pay | Admitting: Emergency Medicine

## 2022-04-07 ENCOUNTER — Ambulatory Visit
Admission: EM | Admit: 2022-04-07 | Discharge: 2022-04-07 | Disposition: A | Discharge status: Home / Self Care | Payer: Medicaid Other

## 2022-04-07 DIAGNOSIS — R591 Generalized enlarged lymph nodes: Secondary | ICD-10-CM | POA: Diagnosis not present

## 2022-04-07 NOTE — ED Triage Notes (Signed)
Pt is present today with c/o a left side nodule on his neck. Pt states that he noticed the nodule two months ago after having mono.

## 2022-04-07 NOTE — ED Provider Notes (Signed)
[facility]    CSN: [id number] Arrival date & time: 04/07/22  1353      History   Chief Complaint Chief Complaint  Patient presents with   Thyroid Nodule    HPI Carl Morales is a 21 y.o. male.   Patient here today for evaluation of left-sided nodule has been present since he had mono 2 months ago.  He reports that nodule has decreased in size.  He notes that initially there was no tenderness to nodule but he did develop some mild pain eventually.  He denies any fevers.  He has not had any night sweats.  He is unaware of any weight changes.  The history is provided by the patient.    Past Medical History:  Diagnosis Date   History of scoliosis     There are no problems to display for this patient.   History reviewed. No pertinent surgical history.     Home Medications    Prior to Admission medications   Medication Sig Start Date End Date Taking? Authorizing Provider  lidocaine (XYLOCAINE) 2 % solution Use as directed 15 mLs in the mouth or throat every 6 (six) hours as needed for mouth pain. 11/19/21   Raspet, Noberto Retort, PA-C    Family History Family History  Problem Relation Age of Onset   Healthy Mother    Diabetes Father    Heart attack Paternal Grandfather     Social History Social History   Tobacco Use   Smoking status: Never   Smokeless tobacco: Never  Vaping Use   Vaping Use: Never used  Substance Use Topics   Alcohol use: Never   Drug use: Never     Allergies   Patient has no known allergies.   Review of Systems Review of Systems  Constitutional:  Negative for chills, diaphoresis, fever and unexpected weight change.  Eyes:  Negative for discharge and redness.  Hematological:  Positive for adenopathy.     Physical Exam Triage Vital Signs ED Triage Vitals  Enc Vitals Group     BP 04/07/22 1437 114/83     Pulse Rate 04/07/22 1437 (!) 109     Resp 04/07/22 1437 18     Temp 04/07/22 1437 98.8 F (37.1 C)     Temp src  --      SpO2 04/07/22 1437 98 %     Weight --      Height --      Head Circumference --      Peak Flow --      Pain Score 04/07/22 1436 0     Pain Loc --      Pain Edu? --      Excl. in GC? --    No data found.  Updated Vital Signs BP 114/83   Pulse (!) 109   Temp 98.8 F (37.1 C)   Resp 18   SpO2 98%      Physical Exam Vitals and nursing note reviewed.  Constitutional:      General: He is not in acute distress.    Appearance: Normal appearance. He is not ill-appearing.  HENT:     Head: Normocephalic and atraumatic.  Eyes:     Conjunctiva/sclera: Conjunctivae normal.  Cardiovascular:     Rate and Rhythm: Normal rate.  Pulmonary:     Effort: Pulmonary effort is normal. No respiratory distress.  Lymphadenopathy:     Upper Body:     Left upper body: Supraclavicular adenopathy present.  Comments: Questionable small left  supraclavicular lymph node palpated  Neurological:     Mental Status: He is alert.  Psychiatric:        Mood and Affect: Mood normal.        Behavior: Behavior normal.        Thought Content: Thought content normal.      UC Treatments / Results  Labs (all labs ordered are listed, but only abnormal results are displayed) Labs Reviewed - No data to display  EKG   Radiology No results found.  Procedures Procedures (including critical care time)  Medications Ordered in UC Medications - No data to display  Initial Impression / Assessment and Plan / UC Course  I have reviewed the triage vital signs and the nursing notes.  Pertinent labs & imaging results that were available during my care of the patient were reviewed by me and considered in my medical decision making (see chart for details).    Given lymphadenopathy initially noted with monoinfection and decreased size since that time suspect benign nature.  I did recommend follow-up with PCP and appointment made for same today in office.  Encouraged sooner follow-up with any further  concerns.  Final Clinical Impressions(s) / UC Diagnoses   Final diagnoses:  Lymphadenopathy   Discharge Instructions   None    ED Prescriptions   None    PDMP not reviewed this encounter.   Tomi Bamberger, PA-C 04/07/22 1506

## 2022-04-18 ENCOUNTER — Encounter: Payer: Self-pay | Admitting: Physician Assistant

## 2022-04-18 ENCOUNTER — Ambulatory Visit (INDEPENDENT_AMBULATORY_CARE_PROVIDER_SITE_OTHER): Payer: Medicaid Other | Admitting: Physician Assistant

## 2022-04-18 VITALS — BP 138/82 | HR 91 | Temp 99.0°F | Ht 77.0 in | Wt 158.4 lb

## 2022-04-18 DIAGNOSIS — Z23 Encounter for immunization: Secondary | ICD-10-CM | POA: Diagnosis not present

## 2022-04-18 DIAGNOSIS — B2780 Other infectious mononucleosis without complication: Secondary | ICD-10-CM

## 2022-04-18 DIAGNOSIS — R591 Generalized enlarged lymph nodes: Secondary | ICD-10-CM

## 2022-04-18 DIAGNOSIS — J302 Other seasonal allergic rhinitis: Secondary | ICD-10-CM | POA: Diagnosis not present

## 2022-04-18 LAB — CBC WITH DIFFERENTIAL/PLATELET
Basophils Absolute: 0 10*3/uL (ref 0.0–0.1)
Basophils Relative: 0.9 % (ref 0.0–3.0)
Eosinophils Absolute: 0 10*3/uL (ref 0.0–0.7)
Eosinophils Relative: 1.4 % (ref 0.0–5.0)
HCT: 42.8 % (ref 39.0–52.0)
Hemoglobin: 14.4 g/dL (ref 13.0–17.0)
Lymphocytes Relative: 38.2 % (ref 12.0–46.0)
Lymphs Abs: 1.3 10*3/uL (ref 0.7–4.0)
MCHC: 33.5 g/dL (ref 30.0–36.0)
MCV: 86.7 fl (ref 78.0–100.0)
Monocytes Absolute: 0.2 10*3/uL (ref 0.1–1.0)
Monocytes Relative: 6.1 % (ref 3.0–12.0)
Neutro Abs: 1.8 10*3/uL (ref 1.4–7.7)
Neutrophils Relative %: 53.4 % (ref 43.0–77.0)
Platelets: 159 10*3/uL (ref 150.0–400.0)
RBC: 4.94 Mil/uL (ref 4.22–5.81)
RDW: 13.7 % (ref 11.5–15.5)
WBC: 3.4 10*3/uL — ABNORMAL LOW (ref 4.0–10.5)

## 2022-04-18 LAB — COMPREHENSIVE METABOLIC PANEL
ALT: 13 U/L (ref 0–53)
AST: 19 U/L (ref 0–37)
Albumin: 4.9 g/dL (ref 3.5–5.2)
Alkaline Phosphatase: 84 U/L (ref 39–117)
BUN: 17 mg/dL (ref 6–23)
CO2: 28 mEq/L (ref 19–32)
Calcium: 10.1 mg/dL (ref 8.4–10.5)
Chloride: 104 mEq/L (ref 96–112)
Creatinine, Ser: 1.08 mg/dL (ref 0.40–1.50)
GFR: 97.97 mL/min (ref >60.00)
Glucose, Bld: 94 mg/dL (ref 70–99)
Potassium: 4.5 mEq/L (ref 3.5–5.1)
Sodium: 140 mEq/L (ref 135–145)
Total Bilirubin: 1.6 mg/dL — ABNORMAL HIGH (ref 0.2–1.2)
Total Protein: 7.8 g/dL (ref 6.0–8.3)

## 2022-04-18 LAB — TSH: TSH: 1.19 u[IU]/mL (ref 0.35–5.50)

## 2022-04-18 NOTE — Patient Instructions (Signed)
Welcome to [facility]! It was a pleasure meeting you today.  Labs today Tdap today  Monitor symptoms, call if concerns  As discussed, Please schedule a 2 month follow up visit today.  PLEASE NOTE:  If you had any LAB tests please let us know if you have not heard back within a few days. You may see your results on MyChart before we have a chance to review them but we will give you a call once they are reviewed by [place]. If we ordered any REFERRALS today, please let us know if you have not heard from their office within the next two weeks. Let us know through MyChart if you are needing REFILLS, or have your pharmacy send [place] the request. You can also use MyChart to communicate with me or any office staff.  Please try these tips to maintain a healthy lifestyle:  Eat most of your calories during the day when you are active. Eliminate processed foods including packaged sweets (pies, cakes, cookies), reduce intake of potatoes, white bread, white pasta, and white rice. Look for whole grain options, oat flour or almond flour.  Each meal should contain half fruits/vegetables, one quarter protein, and one quarter carbs (no bigger than a computer mouse).  Cut down on sweet beverages. This includes juice, soda, and sweet tea. Also watch fruit intake, though this is a healthier sweet option, it still contains natural sugar! Limit to 3 servings daily.  Drink at least 1 glass of water with each meal and aim for at least 8 glasses (64 ounces) per day.  Exercise at least 150 minutes every week to the best of your ability.    Take Care,  Chasitie Passey, PA-C

## 2022-04-18 NOTE — Progress Notes (Signed)
Subjective:    Patient ID: Carl Morales, male    DOB: 2000-12-29, 21 y.o.   MRN: [id number]  Chief Complaint  Patient presents with   Establish Care    NP into est care, not seen a PCP in 3 years; pt states he has a concern about a mass/lump on left side of his lower neck that has been present since having Mono back in the Spring.    HPI 21 y.o. patient presents today for new patient establishment with me.  Patient was previously established with PCP about 3 years ago. Works at [employer] full-time the last 2 months.   Current Care Team: None   Acute Concerns: Mono in April 2023 - seen at urgent care - was having sore throat and flushing, some swollen lymph nodes - first noticed mass over left clavicle, seems to have persisted, but smaller than before, and others went down in size. Worries about this area. Sometimes has some pain.  No fevers or chills. 7 lb wt loss since January, also has new full-time job in this time. No night sweats. No other symptoms to report. Overall good health.    Past Medical History:  Diagnosis Date   History of scoliosis     History reviewed. No pertinent surgical history.  Family History  Problem Relation Age of Onset   Healthy Mother    Diabetes Father    Heart attack Paternal Grandfather     Social History   Tobacco Use   Smoking status: Never   Smokeless tobacco: Never  Vaping Use   Vaping Use: Never used  Substance Use Topics   Alcohol use: Never   Drug use: Never     Allergies  Allergen Reactions   Peanut-Containing Drug Products Anaphylaxis and Swelling    Review of Systems NEGATIVE UNLESS OTHERWISE INDICATED IN HPI      Objective:     BP 138/82 (BP Location: Left Arm)   Pulse 91   Temp 99 F (37.2 C) (Temporal)   Ht 6\' 5"  (1.956 m)   Wt 158 lb 6.4 oz (71.8 kg)   SpO2 99%   BMI 18.78 kg/m   Wt Readings from Last 3 Encounters:  04/18/22 158 lb 6.4 oz (71.8 kg)  08/24/21 165 lb (74.8 kg)  11/12/20 160 lb 6.4 oz  (72.8 kg)    BP Readings from Last 3 Encounters:  04/18/22 138/82  04/07/22 114/83  11/26/21 132/74     Physical Exam Vitals and nursing note reviewed.  Constitutional:      General: He is not in acute distress.    Appearance: Normal appearance. He is not toxic-appearing.  HENT:     Head: Normocephalic and atraumatic.     Right Ear: Tympanic membrane, ear canal and external ear normal.     Left Ear: Tympanic membrane, ear canal and external ear normal.     Nose: Nose normal.     Mouth/Throat:     Mouth: Mucous membranes are moist.     Pharynx: Oropharynx is clear.  Eyes:     Extraocular Movements: Extraocular movements intact.     Conjunctiva/sclera: Conjunctivae normal.     Pupils: Pupils are equal, round, and reactive to light.  Neck:     Thyroid: No thyroid mass, thyromegaly or thyroid tenderness.     Comments: Few small scattered cervical lymph nodes, mobile, non-tender; area of his concern - left supraclavicular - possibly very small non-tender mobile lymph node; certainly no nodes standing out as  firm, large, or suspicious Cardiovascular:     Rate and Rhythm: Normal rate and regular rhythm.     Pulses: Normal pulses.     Heart sounds: Normal heart sounds. No murmur heard. Pulmonary:     Effort: Pulmonary effort is normal.     Breath sounds: Normal breath sounds.  Musculoskeletal:        General: Normal range of motion.     Cervical back: Normal range of motion and neck supple.     Comments: Scoliosis of back noted  Skin:    General: Skin is warm and dry.  Neurological:     General: No focal deficit present.     Mental Status: He is alert and oriented to person, place, and time.  Psychiatric:        Mood and Affect: Mood normal.        Behavior: Behavior normal.        Assessment & Plan:  Lymphadenopathy of head and neck -     CBC with Differential/Platelet -     Comprehensive metabolic panel -     TSH  Other infectious mononucleosis without  complication -     CBC with Differential/Platelet -     Comprehensive metabolic panel -     TSH  Seasonal allergies  Need for prophylactic vaccination with combined diphtheria-tetanus-pertussis (DTP) vaccine -     Tdap vaccine greater than or equal to 43yo IM    Pleasant new 21 year old male patient.  He had a diagnosis of mononucleosis in April of this year. Symptoms have resolved. Reassured patient that I do not see any suspicious masses on exam. Will check baseline labs. Updated Tdap in office.  Cont regular OTC treatment for allergies. Call sooner if any concerns come up.     Return in about 2 months (around 06/18/2022) for recheck lymph nodes.  This note was prepared with assistance of Systems analyst. Occasional wrong-word or sound-a-like substitutions may have occurred due to the inherent limitations of voice recognition software.    Halana Deisher M Vivian Okelley, PA-C

## 2022-06-17 ENCOUNTER — Ambulatory Visit (INDEPENDENT_AMBULATORY_CARE_PROVIDER_SITE_OTHER): Payer: Medicaid Other | Admitting: Physician Assistant

## 2022-06-17 ENCOUNTER — Encounter: Payer: Self-pay | Admitting: Physician Assistant

## 2022-06-17 VITALS — BP 130/78 | HR 74 | Temp 97.1°F | Ht 77.0 in | Wt 168.0 lb

## 2022-06-17 DIAGNOSIS — R591 Generalized enlarged lymph nodes: Secondary | ICD-10-CM | POA: Diagnosis not present

## 2022-06-17 NOTE — Progress Notes (Signed)
Subjective:    Patient ID: Carl Morales, male    DOB: 27-Feb-2001, 21 y.o.   MRN: [id number]  Chief Complaint  Patient presents with   Follow-up    Pt in for 2 mon f/u to recheck lymph nodes; pt says left side lymph node still swells on and off getting hard at tines but not painful or bothersome    HPI Patient is in today for follow-up on lymph nodes. States he has been feeling well, no concerns. Has not had any pain or swelling. No fevers or chills. No weight loss. Working Friday, Sat, Sun at [employer] and doing well.  Past Medical History:  Diagnosis Date   History of scoliosis     History reviewed. No pertinent surgical history.  Family History  Problem Relation Age of Onset   Healthy Mother    Diabetes Father    Heart attack Paternal Grandfather     Social History   Tobacco Use   Smoking status: Never   Smokeless tobacco: Never  Vaping Use   Vaping Use: Never used  Substance Use Topics   Alcohol use: Never   Drug use: Never     Allergies  Allergen Reactions   Peanut-Containing Drug Products Anaphylaxis and Swelling    Review of Systems NEGATIVE UNLESS OTHERWISE INDICATED IN HPI      Objective:     BP 138/82 (BP Location: Right Arm)   Pulse 74   Temp (!) 97.1 F (36.2 C) (Temporal)   Ht 6\' 5"  (1.956 m)   Wt 168 lb (76.2 kg)   SpO2 98%   BMI 19.92 kg/m   Wt Readings from Last 3 Encounters:  06/17/22 168 lb (76.2 kg)  04/18/22 158 lb 6.4 oz (71.8 kg)  08/24/21 165 lb (74.8 kg)    BP Readings from Last 3 Encounters:  06/17/22 138/82  04/18/22 138/82  04/07/22 114/83     Physical Exam Vitals and nursing note reviewed.  Constitutional:      General: He is not in acute distress.    Appearance: Normal appearance. He is not toxic-appearing.  HENT:     Head: Normocephalic and atraumatic.     Right Ear: Tympanic membrane, ear canal and external ear normal.     Left Ear: Tympanic membrane, ear canal and external ear normal.     Nose: Nose  normal.     Mouth/Throat:     Mouth: Mucous membranes are moist.     Pharynx: Oropharynx is clear.  Eyes:     Extraocular Movements: Extraocular movements intact.     Conjunctiva/sclera: Conjunctivae normal.     Pupils: Pupils are equal, round, and reactive to light.  Cardiovascular:     Rate and Rhythm: Normal rate and regular rhythm.     Pulses: Normal pulses.     Heart sounds: Normal heart sounds.  Pulmonary:     Effort: Pulmonary effort is normal.     Breath sounds: Normal breath sounds.  Musculoskeletal:        General: Normal range of motion.     Cervical back: Normal range of motion and neck supple.  Lymphadenopathy:     Head:     Right side of head: No submental or submandibular adenopathy.     Left side of head: No submental or submandibular adenopathy.     Cervical: No cervical adenopathy.     Upper Body:     Right upper body: No supraclavicular adenopathy.     Left upper body:  No supraclavicular adenopathy.  Skin:    General: Skin is warm and dry.  Neurological:     General: No focal deficit present.     Mental Status: He is alert and oriented to person, place, and time.  Psychiatric:        Mood and Affect: Mood normal.        Behavior: Behavior normal.        Assessment & Plan:  Lymphadenopathy of head and neck   No lymph nodes appreciated on exam, reassuring. Well-appearing, no concerns, plan to recheck as needed. CPE in the Spring. Encouraged to keep up good work.     Return in about 6 months (around 12/16/2022) for CPE, fasting labs .    Franko Hilliker M Tanja Gift, PA-C

## 2022-12-14 ENCOUNTER — Encounter: Payer: Medicaid Other | Admitting: Physician Assistant

## 2023-05-15 ENCOUNTER — Ambulatory Visit (INDEPENDENT_AMBULATORY_CARE_PROVIDER_SITE_OTHER): Payer: Self-pay

## 2023-05-15 ENCOUNTER — Other Ambulatory Visit: Payer: Self-pay | Admitting: Family Medicine

## 2023-05-15 DIAGNOSIS — S6701XA Crushing injury of right thumb, initial encounter: Secondary | ICD-10-CM

## 2023-05-15 DIAGNOSIS — T1490XA Injury, unspecified, initial encounter: Secondary | ICD-10-CM

## 2023-12-25 ENCOUNTER — Emergency Department (HOSPITAL_COMMUNITY)
Admission: EM | Admit: 2023-12-25 | Discharge: 2023-12-26 | Disposition: A | Discharge status: Home / Self Care | Attending: Emergency Medicine | Admitting: Emergency Medicine

## 2023-12-25 DIAGNOSIS — Z202 Contact with and (suspected) exposure to infections with a predominantly sexual mode of transmission: Secondary | ICD-10-CM

## 2023-12-25 DIAGNOSIS — Z91010 Allergy to peanuts: Secondary | ICD-10-CM | POA: Diagnosis not present

## 2023-12-25 MED ORDER — METRONIDAZOLE 500 MG PO TABS
2000.0000 mg | ORAL_TABLET | Freq: Once | ORAL | Status: AC
  Administered 2023-12-25: 2000 mg via ORAL
  Filled 2023-12-25: qty 4

## 2023-12-25 NOTE — ED Provider Notes (Cosign Needed)
 [name] Provider Note   CSN: [id number] Arrival date & time: 12/25/23  2309     History  Chief Complaint  Patient presents with   Exposure to STD    Carl Morales is a 23 y.o. male presents to Emergency Department for exposure to trichomonas.  He reports that his male partner tested positive for trichomonas yesterday at [facility] ED.  She took treatment yesterday.  They last had intercourse couple days ago and his partner was having symptoms at this time.  He denies any complaints.  No complaints of testicular pain, penile discharge, difficulty with urination, testicular swelling, nor fevers.   Exposure to STD Pertinent negatives include no chest pain, no abdominal pain, no headaches and no shortness of breath.       Home Medications Prior to Admission medications   Medication Sig Start Date End Date Taking? Authorizing Provider  fexofenadine-pseudoephedrine (ALLEGRA-D 24) 180-240 MG 24 hr tablet Take 1 tablet by mouth daily.    [provider]  lidocaine  (XYLOCAINE ) 2 % solution Use as directed 15 mLs in the mouth or throat every 6 (six) hours as needed for mouth pain. Patient not taking: Reported on 06/17/2022 11/19/21   Raspet, Erin K, PA-C      Allergies    Peanut-containing drug products    Review of Systems   Review of Systems  Constitutional:  Negative for chills, fatigue and fever.  Respiratory:  Negative for cough, chest tightness, shortness of breath and wheezing.   Cardiovascular:  Negative for chest pain and palpitations.  Gastrointestinal:  Negative for abdominal pain, constipation, diarrhea, nausea and vomiting.  Neurological:  Negative for dizziness, seizures, weakness, light-headedness, numbness and headaches.    Physical Exam Updated Vital Signs BP 139/75 (BP Location: Left Arm)   Pulse 81   Temp 98.1 F (36.7 C) (Oral)   Resp 15   Ht 6\' 5"  (1.956 m)   Wt 76.7 kg   SpO2 100%   BMI 20.04 kg/m   Physical Exam Vitals and nursing note reviewed.  Constitutional:      General: He is not in acute distress.    Appearance: Normal appearance.  HENT:     Head: Normocephalic and atraumatic.  Eyes:     Conjunctiva/sclera: Conjunctivae normal.  Cardiovascular:     Rate and Rhythm: Normal rate.  Pulmonary:     Effort: Pulmonary effort is normal. No respiratory distress.     Breath sounds: Normal breath sounds.  Chest:     Chest wall: No tenderness.  Abdominal:     General: Bowel sounds are normal. There is no distension.     Palpations: Abdomen is soft.     Tenderness: There is no abdominal tenderness. There is no guarding.  Genitourinary:    Penis: Normal.      Testes: Normal.     Comments: No penile discharge.  No testicular tenderness nor swelling bilaterally.  No skin changes to thighs, testicles, penis. Skin:    General: Skin is warm.     Capillary Refill: Capillary refill takes less than 2 seconds.     Coloration: Skin is not jaundiced or pale.  Neurological:     Mental Status: He is alert and oriented to person, place, and time. Mental status is at baseline.   Alvis Jourdain RN chaperoned GU exma  ED Results / Procedures / Treatments   Labs (all labs ordered are listed, but only abnormal results are displayed) Labs Reviewed  URINE CYTOLOGY ANCILLARY ONLY    EKG None  Radiology No results found.  Procedures Procedures    Medications Ordered in ED Medications  metroNIDAZOLE (FLAGYL) tablet 2,000 mg (has no administration in time range)    ED Course/ Medical Decision Making/ A&P                                 Medical Decision Making Risk Prescription drug management.   Patient presents to the ED for concern of exposure to trichomonas, this involves an extensive number of treatment options, and is a complaint that carries with it a high risk of complications and morbidity.  The differential diagnosis includes gonorrhea, chlamydia, trichomonas,  UTI, cellulitis, infection   Co morbidities that complicate the patient evaluation  None   Additional history obtained:  Additional history obtained from [facility]   External records from outside source obtained and reviewed including triage RN note   Lab Tests:  I Ordered, and personally interpreted labs.  The pertinent results include:   Urine cytology pending     Medicines ordered and prescription drug management:  I ordered medication including metro  for trich exposure  Reevaluation of the patient after these medicines showed that the patient stayed the same I have reviewed the patients home medicines and have made adjustments as needed     Problem List / ED Course:  Exposure to STD GU exam unremarkable Low concern for UTI with no urinary symtpoms As patient recently tested positive for trichomonas and has been having symptoms for a couple weeks prior, I think it is reasonable to prophylactically treat patient for trichomonas.  Urine cytology pending I offered HIV, GC chlamydia testing however patient does not wish to do this at this time.  Reevaluation:  After the interventions noted above, I reevaluated the patient and found that they have :stayed the same     Dispostion:  After consideration of the diagnostic results and the patients response to treatment, I feel that the patent would benefit from outpatient management.   Discussed ED workup, disposition, return to ED precautions with patient who expresses understanding agrees with plan.  All questions answered to their satisfaction.  They are agreeable to plan.  Discharge instructions provided on paperwork  Final Clinical Impression(s) / ED Diagnoses Final diagnoses:  Exposure to STD    Rx / DC Orders ED Discharge Orders     None         Royann Cords, PA 12/25/23 2354

## 2023-12-25 NOTE — Discharge Instructions (Signed)
 Finger let us  evaluate you today.  You may check up on your results on your MyChart.  We have given you a prophylactic dose here in the emergency department as you likely were exposed  Return to emergency department if you experience penile discharge, testicular pain or swelling, warmth, redness to penis or testicles, inability to urinate, burning with urination

## 2023-12-25 NOTE — ED Triage Notes (Signed)
 Patient arrived with significant other who states she tested positive for Trichomoniasis and suggested he come in for treatment tonight.

## 2023-12-27 LAB — URINE CYTOLOGY ANCILLARY ONLY
Comment: NEGATIVE
Trichomonas: POSITIVE — AB

## 2024-01-13 ENCOUNTER — Emergency Department (HOSPITAL_BASED_OUTPATIENT_CLINIC_OR_DEPARTMENT_OTHER)
Admission: EM | Admit: 2024-01-13 | Discharge: 2024-01-13 | Disposition: A | Discharge status: Home / Self Care | Attending: Emergency Medicine | Admitting: Emergency Medicine

## 2024-01-13 ENCOUNTER — Emergency Department (HOSPITAL_BASED_OUTPATIENT_CLINIC_OR_DEPARTMENT_OTHER): Admitting: Radiology

## 2024-01-13 ENCOUNTER — Other Ambulatory Visit: Payer: Self-pay

## 2024-01-13 ENCOUNTER — Emergency Department (HOSPITAL_BASED_OUTPATIENT_CLINIC_OR_DEPARTMENT_OTHER)

## 2024-01-13 DIAGNOSIS — Z91010 Allergy to peanuts: Secondary | ICD-10-CM | POA: Insufficient documentation

## 2024-01-13 DIAGNOSIS — W19XXXA Unspecified fall, initial encounter: Secondary | ICD-10-CM | POA: Insufficient documentation

## 2024-01-13 DIAGNOSIS — Y9367 Activity, basketball: Secondary | ICD-10-CM | POA: Diagnosis not present

## 2024-01-13 DIAGNOSIS — M79643 Pain in unspecified hand: Secondary | ICD-10-CM

## 2024-01-13 DIAGNOSIS — M25531 Pain in right wrist: Secondary | ICD-10-CM | POA: Diagnosis present

## 2024-01-13 MED ORDER — KETOROLAC TROMETHAMINE 60 MG/2ML IM SOLN
30.0000 mg | Freq: Once | INTRAMUSCULAR | Status: AC
  Administered 2024-01-13: 30 mg via INTRAMUSCULAR
  Filled 2024-01-13: qty 2

## 2024-01-13 NOTE — ED Notes (Signed)
 RN reviewed discharge instructions with pt. Pt verbalized understanding and had no further questions

## 2024-01-13 NOTE — ED Provider Notes (Signed)
  [place] EMERGENCY DEPARTMENT AT [facility] Provider Note   CSN: [id number] Arrival date & time: 01/13/24  1135     Patient presents with: Wrist Pain   Carl Morales is a 23 y.o. male.  {Add pertinent medical, surgical, social history, OB history to HPI:32947}  Wrist Pain     23 year old male presenting to the emerged part with right wrist pain and hand pain after a fall while playing basketball yesterday.  The patient sustained a FOOSH injury.  He has had pain along the bilateral wrist as well as pain at the anatomic snuffbox.  He did not hit his head and did not lose consciousness.  He denies any other injuries or complaints.  Prior to Admission medications   Medication Sig Start Date End Date Taking? Authorizing Provider  fexofenadine-pseudoephedrine (ALLEGRA-D 24) 180-240 MG 24 hr tablet Take 1 tablet by mouth daily.    [provider]  lidocaine  (XYLOCAINE ) 2 % solution Use as directed 15 mLs in the mouth or throat every 6 (six) hours as needed for mouth pain. Patient not taking: Reported on 06/17/2022 11/19/21   Raspet, Erin K, PA-C    Allergies: Peanut-containing drug products    Review of Systems  Updated Vital Signs BP 130/80 (BP Location: Left Arm)   Pulse 71   Temp 98.7 F (37.1 C) (Oral)   Resp 18   SpO2 99%   Physical Exam  (all labs ordered are listed, but only abnormal results are displayed) Labs Reviewed - No data to display  EKG: None  Radiology: No results found.  {Document cardiac monitor, telemetry assessment procedure when appropriate:32947} Procedures   Medications Ordered in the ED - No data to display    {Click here for ABCD2, HEART and other calculators REFRESH Note before signing:1}                              Medical Decision Making Amount and/or Complexity of Data Reviewed Radiology: ordered.  Risk Prescription drug management.   ***  {Document critical care time when appropriate  Document review  of labs and clinical decision tools ie CHADS2VASC2, etc  Document your independent review of radiology images and any outside records  Document your discussion with family members, caretakers and with consultants  Document social determinants of health affecting pt's care  Document your decision making why or why not admission, treatments were needed:32947:::1}   Final diagnoses:  None    ED Discharge Orders     None

## 2024-01-13 NOTE — Discharge Instructions (Addendum)
 Your x-ray imaging was negative.  Take Tylenol and ibuprofen  for pain control.  A splint has been placed for comfort.  Follow-up outpatient for repeat assessment by hand surgery in 1 week's time given your anatomic snuffbox tenderness as this can sometimes indicate a scaphoid fracture that was radiographically occult

## 2024-01-13 NOTE — ED Triage Notes (Signed)
 C/o R wrist pain after fall while playing basketball yesterday.
